# Patient Record
Sex: Female | Born: 1965 | Race: White | Hispanic: Refuse to answer | Marital: Married | Smoking: Former smoker
Health system: Southern US, Community
[De-identification: ages and names within clinical notes are randomized; demographics above are authoritative.]

---

## 2008-08-03 ENCOUNTER — Telehealth (INDEPENDENT_AMBULATORY_CARE_PROVIDER_SITE_OTHER): Payer: Self-pay | Admitting: *Deleted

## 2008-08-05 ENCOUNTER — Ambulatory Visit: Payer: Self-pay | Admitting: Internal Medicine

## 2008-08-05 DIAGNOSIS — M25559 Pain in unspecified hip: Secondary | ICD-10-CM

## 2008-08-05 DIAGNOSIS — R5382 Chronic fatigue, unspecified: Secondary | ICD-10-CM

## 2008-08-05 LAB — CONVERTED CEMR LAB
Basophils Absolute: 0.1 10*3/uL (ref 0.0–0.1)
Basophils Relative: 1.5 % (ref 0.0–3.0)
CO2: 27 meq/L (ref 19–32)
Chloride: 105 meq/L (ref 96–112)
Creatinine, Ser: 0.8 mg/dL (ref 0.4–1.2)
Direct LDL: 171.3 mg/dL
Eosinophils Absolute: 0 10*3/uL (ref 0.0–0.7)
Eosinophils Relative: 0.5 % (ref 0.0–5.0)
GFR calc non Af Amer: 83 mL/min
HDL: 61.9 mg/dL (ref >39.0)
LDL Cholesterol: 175 mg/dL — ABNORMAL HIGH (ref 0–99)
Lymphocytes Relative: 29.5 % (ref 12.0–46.0)
MCHC: 34.7 g/dL (ref 30.0–36.0)
Neutrophils Relative %: 62.4 % (ref 43.0–77.0)
Platelets: 218 10*3/uL (ref 150–400)
Potassium: 3.8 meq/L (ref 3.5–5.1)
RBC: 4.07 M/uL (ref 3.87–5.11)
Rhuematoid fact SerPl-aCnc: <20 intl units/mL — ABNORMAL LOW (ref 0.0–20.0)
Sed Rate: 15 mm/hr (ref 0–22)
VLDL: 11 mg/dL (ref 0–40)
Vit D, 25-Hydroxy: 23 ng/mL — ABNORMAL LOW (ref 30–89)
Vitamin B-12: 395 pg/mL (ref 211–911)
WBC: 5.2 10*3/uL (ref 4.5–10.5)

## 2009-10-05 ENCOUNTER — Ambulatory Visit: Payer: Self-pay | Admitting: Internal Medicine

## 2009-10-05 DIAGNOSIS — R143 Flatulence: Secondary | ICD-10-CM

## 2009-10-05 DIAGNOSIS — K219 Gastro-esophageal reflux disease without esophagitis: Secondary | ICD-10-CM | POA: Insufficient documentation

## 2009-10-05 DIAGNOSIS — R141 Gas pain: Secondary | ICD-10-CM

## 2009-10-05 DIAGNOSIS — R142 Eructation: Secondary | ICD-10-CM

## 2009-10-12 LAB — CONVERTED CEMR LAB
Alkaline Phosphatase: 40 units/L (ref 39–117)
BUN: 15 mg/dL (ref 6–23)
Basophils Absolute: 0 10*3/uL (ref 0.0–0.1)
Basophils Relative: 0.6 % (ref 0.0–3.0)
Bilirubin, Direct: 0.1 mg/dL (ref 0.0–0.3)
CO2: 29 meq/L (ref 19–32)
Calcium: 9.2 mg/dL (ref 8.4–10.5)
Chloride: 107 meq/L (ref 96–112)
Creatinine, Ser: 0.8 mg/dL (ref 0.4–1.2)
Direct LDL: 182.5 mg/dL
Eosinophils Absolute: 0.1 10*3/uL (ref 0.0–0.7)
Glucose, Bld: 96 mg/dL (ref 70–99)
HDL: 57.6 mg/dL (ref >39.00)
Ketones, ur: NEGATIVE mg/dL
Lymphocytes Relative: 25.5 % (ref 12.0–46.0)
MCHC: 34.2 g/dL (ref 30.0–36.0)
MCV: 90.9 fL (ref 78.0–100.0)
Monocytes Absolute: 0.3 10*3/uL (ref 0.1–1.0)
Neutrophils Relative %: 65.5 % (ref 43.0–77.0)
Platelets: 261 10*3/uL (ref 150.0–400.0)
RDW: 15.8 % — ABNORMAL HIGH (ref 11.5–14.6)
Sed Rate: 20 mm/hr (ref 0–22)
Specific Gravity, Urine: <=1.005 (ref 1.000–1.030)
TSH: 2.76 microintl units/mL (ref 0.35–5.50)
Total Protein, Urine: NEGATIVE mg/dL
Total Protein: 7 g/dL (ref 6.0–8.3)
Triglycerides: 54 mg/dL (ref 0.0–149.0)
Urine Glucose: NEGATIVE mg/dL
VLDL: 10.8 mg/dL (ref 0.0–40.0)

## 2010-07-18 NOTE — Assessment & Plan Note (Signed)
Summary: CPX /BCBS / # / CD   Vital Signs:  Patient profile:   45 year old female Height:      62 inches Weight:      137.50 pounds BMI:     25.24 O2 Sat:      97 % on Room air Temp:     97.5 degrees F oral Pulse rate:   64 / minute BP sitting:   100 / 68  (left arm) Cuff size:   regular  Vitals Entered By: Lucious Groves (October 05, 2009 8:48 AM)  O2 Flow:  Room air CC: CPX./kb Is Patient Diabetic? No Pain Assessment Patient in pain? no        CC:  CPX./kb.  History of Present Illness: The patient presents for a wellness examination  C/o occasional GERD; had an episode of abd and CP 6 months ago after eating old quinoa  Current Medications (verified): 1)  Ibu 800 Mg Tabs (Ibuprofen) .Marland Kitchen.. 1 Tab By Mouth Three Times A Day As Needed Pain 2)  Vitamin D3 1000 Unit  Tabs (Cholecalciferol) .Marland Kitchen.. 1 By Mouth Daily 3)  Vitamin D 16109 Unit  Caps (Ergocalciferol) .Marland Kitchen.. 1 By Mouth Weekly  Allergies (verified): No Known Drug Allergies  Past History:  Past Surgical History: Last updated: 08/05/2008 Denies surgical history  Family History: Last updated: 08/05/2008 M 52 MI F DM Family History of CAD Female 1st degree relative <60  Social History: Last updated: 08/05/2008 Occupation: business owner Married Former Smoker quit 2005  Past Medical History: Migraines Fam h/o CAD Vit D def 2010 GERD 2010  Review of Systems  The patient denies anorexia, fever, weight loss, weight gain, vision loss, decreased hearing, hoarseness, chest pain, syncope, dyspnea on exertion, peripheral edema, prolonged cough, headaches, hemoptysis, abdominal pain, melena, hematochezia, severe indigestion/heartburn, hematuria, incontinence, genital sores, muscle weakness, suspicious skin lesions, transient blindness, difficulty walking, depression, unusual weight change, abnormal bleeding, enlarged lymph nodes, angioedema, and breast masses.    Physical Exam  General:   Well-developed,well-nourished,in no acute distress; alert,appropriate and cooperative throughout examination Head:  Normocephalic and atraumatic without obvious abnormalities. No apparent alopecia or balding. Eyes:  No corneal or conjunctival inflammation noted. EOMI. Perrla. Ears:  External ear exam shows no significant lesions or deformities.  Otoscopic examination reveals clear canals, tympanic membranes are intact bilaterally without bulging, retraction, inflammation or discharge. Hearing is grossly normal bilaterally. Nose:  External nasal examination shows no deformity or inflammation. Nasal mucosa are pink and moist without lesions or exudates. Mouth:  Oral mucosa and oropharynx without lesions or exudates.  Teeth in good repair. Neck:  No deformities, masses, or tenderness noted. Chest Wall:  No deformities, masses, or tenderness noted. Lungs:  Normal respiratory effort, chest expands symmetrically. Lungs are clear to auscultation, no crackles or wheezes. Heart:  Normal rate and regular rhythm. S1 and S2 normal without gallop, murmur, click, rub or other extra sounds. Abdomen:  Bowel sounds positive,abdomen soft and non-tender without masses, organomegaly or hernias noted. Msk:  WNL Extremities:  No clubbing, cyanosis, edema, or deformity noted with normal full range of motion of all joints.   Neurologic:  No cranial nerve deficits noted. Station and gait are normal. Plantar reflexes are down-going bilaterally. DTRs are symmetrical throughout. Sensory, motor and coordinative functions appear intact. Skin:  Intact without suspicious lesions or rashes Cervical Nodes:  No lymphadenopathy noted Inguinal Nodes:  No significant adenopathy Psych:  Cognition and judgment appear intact. Alert and cooperative with normal attention span and concentration.  No apparent delusions, illusions, hallucinations   Impression & Recommendations:  Problem # 1:  WELL ADULT EXAM (ICD-V70.0) Assessment  New Health and age related issues were discussed. Available screening tests and vaccinations were discussed as well. Healthy life style including good diet and execise was discussed.  The labs were reviewed with the patient.  Orders: TLB-BMP (Basic Metabolic Panel-BMET) (80048-METABOL) TLB-CBC Platelet - w/Differential (85025-CBCD) TLB-Hepatic/Liver Function Pnl (80076-HEPATIC) TLB-Lipid Panel (80061-LIPID) TLB-Sedimentation Rate (ESR) (85652-ESR) TLB-TSH (Thyroid Stimulating Hormone) (84443-TSH) TLB-Udip ONLY (81003-UDIP)  Problem # 2:  GERD (ICD-530.81) Assessment: New  Declined EGD, [place] Her updated medication list for this problem includes:    Prilosec 20 Mg Cpdr (Omeprazole) .Marland Kitchen... 1 by mouth once daily as needed reflux  Orders: TLB-Sedimentation Rate (ESR) (85652-ESR) TLB-H. Pylori Abs(Helicobacter Pylori) (86677-HELICO)  Problem # 3:  FATIGUE (ICD-780.79) Assessment: Improved  Complete Medication List: 1)  Ibu 800 Mg Tabs (Ibuprofen) .Marland Kitchen.. 1 tab by mouth three times a day as needed pain 2)  Vitamin D3 1000 Unit Tabs (Cholecalciferol) .Marland Kitchen.. 1 by mouth daily 3)  Prilosec 20 Mg Cpdr (Omeprazole) .Marland Kitchen.. 1 by mouth once daily as needed reflux 4)  Metronidazole 250 Mg Tabs (Metronidazole) .Marland Kitchen.. 1 by mouth qid  Other Orders: EKG w/ Interpretation (93000)  Patient Instructions: 1)  Please schedule a follow-up appointment in 1 year. Prescriptions: METRONIDAZOLE 250 MG TABS (METRONIDAZOLE) 1 by mouth qid  #40 x 1   Entered and Authorized by:   Tresa Garter MD   Signed by:   Tresa Garter MD on 10/05/2009   Method used:   Print then Give to Patient   RxID:   [id number]

## 2014-08-18 ENCOUNTER — Telehealth: Payer: Self-pay | Admitting: Internal Medicine

## 2014-08-18 NOTE — Telephone Encounter (Signed)
Pt called request to reestablish, pt stated last ov was 4 years ago and she didn't have insurance that's why she didn't come for check up. Please advise.

## 2014-08-18 NOTE — Telephone Encounter (Signed)
Ok Thx 

## 2014-08-18 NOTE — Telephone Encounter (Signed)
Left vm for pt to call back and schedule an appt with Plot

## 2014-08-24 ENCOUNTER — Ambulatory Visit (INDEPENDENT_AMBULATORY_CARE_PROVIDER_SITE_OTHER): Payer: No Typology Code available for payment source | Admitting: Podiatry

## 2014-08-24 ENCOUNTER — Encounter: Payer: Self-pay | Admitting: Podiatry

## 2014-08-24 VITALS — BP 130/76 | HR 61 | Ht 62.0 in | Wt 140.0 lb

## 2014-08-24 DIAGNOSIS — M216X2 Other acquired deformities of left foot: Secondary | ICD-10-CM

## 2014-08-24 DIAGNOSIS — M216X1 Other acquired deformities of right foot: Secondary | ICD-10-CM | POA: Diagnosis not present

## 2014-08-24 DIAGNOSIS — M722 Plantar fascial fibromatosis: Secondary | ICD-10-CM

## 2014-08-24 DIAGNOSIS — M21969 Unspecified acquired deformity of unspecified lower leg: Secondary | ICD-10-CM | POA: Diagnosis not present

## 2014-08-24 NOTE — Patient Instructions (Signed)
Seen for right heel pain. Noted of weakened first metatarsal bone. Metatarsal binder dispensed x 2. Need custom orthotics.

## 2014-08-24 NOTE — Progress Notes (Signed)
Subjective: 49 year old female presents complaining of pain in right heel. The foot has been bothering her since August 2015. 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 She is unable to run like she used to and unable to perform yoga like usual. Hurts on right heel more than left. Stays on feet much from activity, but sits at work. Wears dress shoe with heels at work.  Review of Systems - Negative except chief complaint.  Objective: Neurovascular status are within normal. No edema or erythema noted.  Dermatologic: Verruca like lesion, plantar wart sub 3 right, 4 left each about 1cm in diameter. She also has similar lesion on her left thumb. Orthopedic: High arched cavus type foot. Hypermobile first bilateral. Excess STJ pronation with weight bearing bilateral. Positive of irregular irregular dorsal spurring at Lisfranc joint left foot.  Right heel pain.   Assessment: Plantar fasciitis right. Hypermobile first ray bilateral. STJ hyperpronation bilateral.  Plan: Reviewed findings and available options. Metatarsal binder dispensed x 2. Return for custom orthotics. Stay in lace up tennis shoes and avoid flat shoes. May take immune boosting vitamins for multiple wart problem.

## 2014-08-30 ENCOUNTER — Ambulatory Visit (INDEPENDENT_AMBULATORY_CARE_PROVIDER_SITE_OTHER): Payer: No Typology Code available for payment source | Admitting: Internal Medicine

## 2014-08-30 ENCOUNTER — Encounter: Payer: Self-pay | Admitting: Internal Medicine

## 2014-08-30 ENCOUNTER — Ambulatory Visit (INDEPENDENT_AMBULATORY_CARE_PROVIDER_SITE_OTHER)
Admission: RE | Admit: 2014-08-30 | Discharge: 2014-08-30 | Disposition: A | Discharge status: Home / Self Care | Payer: No Typology Code available for payment source | Source: Ambulatory Visit | Attending: Internal Medicine | Admitting: Internal Medicine

## 2014-08-30 VITALS — BP 110/84 | HR 68 | Ht 62.0 in | Wt 141.0 lb

## 2014-08-30 DIAGNOSIS — R142 Eructation: Secondary | ICD-10-CM

## 2014-08-30 DIAGNOSIS — R141 Gas pain: Secondary | ICD-10-CM

## 2014-08-30 DIAGNOSIS — Z8249 Family history of ischemic heart disease and other diseases of the circulatory system: Secondary | ICD-10-CM | POA: Insufficient documentation

## 2014-08-30 DIAGNOSIS — M2550 Pain in unspecified joint: Secondary | ICD-10-CM | POA: Insufficient documentation

## 2014-08-30 DIAGNOSIS — R143 Flatulence: Secondary | ICD-10-CM

## 2014-08-30 DIAGNOSIS — E049 Nontoxic goiter, unspecified: Secondary | ICD-10-CM

## 2014-08-30 DIAGNOSIS — M722 Plantar fascial fibromatosis: Secondary | ICD-10-CM

## 2014-08-30 DIAGNOSIS — Z0000 Encounter for general adult medical examination without abnormal findings: Secondary | ICD-10-CM | POA: Insufficient documentation

## 2014-08-30 DIAGNOSIS — R5382 Chronic fatigue, unspecified: Secondary | ICD-10-CM

## 2014-08-30 MED ORDER — ASPIRIN 81 MG PO CHEW: 81.0000 mg | CHEWABLE_TABLET | Freq: Every day | ORAL | Status: DC

## 2014-08-30 MED ORDER — VITAMIN D 1000 UNITS PO TABS: 1000.0000 [IU] | ORAL_TABLET | Freq: Every day | ORAL | Status: AC

## 2014-08-30 NOTE — Assessment & Plan Note (Addendum)
We discussed age appropriate health related issues, including available/recomended screening tests and vaccinations. We discussed a need for adhering to healthy diet and exercise. Labs/EKG were reviewed/ordered. All questions were answered. Pt had a GYN exam. Colonoscopy was declined. Given info on cologuard Vit D qd

## 2014-08-30 NOTE — Progress Notes (Signed)
   Subjective:    HPI  The patient is here for a wellness exam. She is a vegetarian  C/o arthralgias x years - worse Pain is 5-7/10 C/o fatigue  C/o fluid retention C/o insomnia  Wt Readings from Last 3 Encounters:  08/30/14 141 lb (63.957 kg)  08/24/14 140 lb (63.504 kg)  10/05/09 137 lb 8 oz (62.37 kg)   BP Readings from Last 3 Encounters:  08/30/14 110/84  08/24/14 130/76  10/05/09 100/68       Review of Systems  Constitutional: Negative for chills, activity change, appetite change, fatigue and unexpected weight change.  HENT: Negative for congestion, mouth sores and sinus pressure.   Eyes: Negative for visual disturbance.  Respiratory: Negative for cough and chest tightness.   Gastrointestinal: Negative for nausea and abdominal pain.  Genitourinary: Negative for frequency, difficulty urinating and vaginal pain.  Musculoskeletal: Positive for myalgias, back pain, arthralgias and neck pain. Negative for joint swelling, gait problem and neck stiffness.  Skin: Negative for pallor and rash.  Neurological: Negative for dizziness, tremors, weakness, numbness and headaches.  Psychiatric/Behavioral: Negative for suicidal ideas, confusion and sleep disturbance. The patient is not nervous/anxious.        Objective:   Physical Exam  Constitutional: She appears well-developed. No distress.  HENT:  Head: Normocephalic.  Right Ear: External ear normal.  Left Ear: External ear normal.  Nose: Nose normal.  Mouth/Throat: Oropharynx is clear and moist.  Eyes: Conjunctivae are normal. Pupils are equal, round, and reactive to light. Right eye exhibits no discharge. Left eye exhibits no discharge.  Neck: Normal range of motion. Neck supple. No JVD present. No tracheal deviation present. No thyromegaly present.  Cardiovascular: Normal rate, regular rhythm and normal heart sounds.   Pulmonary/Chest: No stridor. No respiratory distress. She has no wheezes.  Abdominal: Soft. Bowel  sounds are normal. She exhibits no distension and no mass. There is no tenderness. There is no rebound and no guarding.  Musculoskeletal: She exhibits no edema or tenderness.  Lymphadenopathy:    She has no cervical adenopathy.  Neurological: She displays normal reflexes. No cranial nerve deficit. She exhibits normal muscle tone. Coordination normal.  Skin: No rash noted. No erythema.  Psychiatric: She has a normal mood and affect. Her behavior is normal. Judgment and thought content normal.  mild goiter Very tender all over  Lab Results  Component Value Date   WBC 5.0 10/05/2009   HGB 12.2 10/05/2009   HCT 35.8* 10/05/2009   PLT 261.0 10/05/2009   GLUCOSE 96 10/05/2009   CHOL 254* 10/05/2009   TRIG 54.0 10/05/2009   HDL 57.60 10/05/2009   LDLDIRECT 182.5 10/05/2009   LDLCALC 175* 08/05/2008   ALT 19 10/05/2009   AST 21 10/05/2009   NA 142 10/05/2009   K 4.4 10/05/2009   CL 107 10/05/2009   CREATININE 0.8 10/05/2009   BUN 15 10/05/2009   CO2 29 10/05/2009   TSH 2.76 10/05/2009         Assessment & Plan:

## 2014-08-30 NOTE — Assessment & Plan Note (Signed)
Painful Thyroid US ordered

## 2014-08-30 NOTE — Assessment & Plan Note (Addendum)
3/16 mother died of an MI EKG Labs Start baby ASA Pt had an ECHO a couple years ago

## 2014-08-30 NOTE — Assessment & Plan Note (Signed)
Labs Pelvis X-ray

## 2014-08-30 NOTE — Assessment & Plan Note (Signed)
Labs

## 2014-08-30 NOTE — Assessment & Plan Note (Signed)
2016 multiple joints, chronic x years  ?etiology Labs Pelvis X ray Rheumatology ref offered Cymbalta option discussed Vit D, ASA po

## 2014-08-30 NOTE — Assessment & Plan Note (Signed)
2016 Dr Raynald KempSheard R>L

## 2014-08-31 ENCOUNTER — Other Ambulatory Visit (INDEPENDENT_AMBULATORY_CARE_PROVIDER_SITE_OTHER): Payer: No Typology Code available for payment source

## 2014-08-31 DIAGNOSIS — M2550 Pain in unspecified joint: Secondary | ICD-10-CM

## 2014-08-31 DIAGNOSIS — Z0000 Encounter for general adult medical examination without abnormal findings: Secondary | ICD-10-CM

## 2014-08-31 DIAGNOSIS — R5382 Chronic fatigue, unspecified: Secondary | ICD-10-CM

## 2014-08-31 DIAGNOSIS — Z8249 Family history of ischemic heart disease and other diseases of the circulatory system: Secondary | ICD-10-CM

## 2014-08-31 DIAGNOSIS — E049 Nontoxic goiter, unspecified: Secondary | ICD-10-CM

## 2014-08-31 LAB — CBC WITH DIFFERENTIAL/PLATELET
BASOS ABS: 0 10*3/uL (ref 0.0–0.1)
Basophils Relative: 1.1 % (ref 0.0–3.0)
Eosinophils Absolute: 0.1 10*3/uL (ref 0.0–0.7)
Eosinophils Relative: 2.6 % (ref 0.0–5.0)
HCT: 36.8 % (ref 36.0–46.0)
Hemoglobin: 12.6 g/dL (ref 12.0–15.0)
Lymphocytes Relative: 42.4 % (ref 12.0–46.0)
Lymphs Abs: 1.8 10*3/uL (ref 0.7–4.0)
MCHC: 34.3 g/dL (ref 30.0–36.0)
MCV: 94 fl (ref 78.0–100.0)
MONOS PCT: 8.5 % (ref 3.0–12.0)
Monocytes Absolute: 0.4 10*3/uL (ref 0.1–1.0)
NEUTROS ABS: 1.9 10*3/uL (ref 1.4–7.7)
Neutrophils Relative %: 45.4 % (ref 43.0–77.0)
PLATELETS: 251 10*3/uL (ref 150.0–400.0)
RBC: 3.91 Mil/uL (ref 3.87–5.11)
RDW: 13.3 % (ref 11.5–15.5)
WBC: 4.2 10*3/uL (ref 4.0–10.5)

## 2014-08-31 LAB — URINALYSIS, ROUTINE W REFLEX MICROSCOPIC
BILIRUBIN URINE: NEGATIVE
Ketones, ur: NEGATIVE
Leukocytes, UA: NEGATIVE
Nitrite: NEGATIVE
Specific Gravity, Urine: <=1.005 — AB (ref 1.000–1.030)
Total Protein, Urine: NEGATIVE
URINE GLUCOSE: NEGATIVE
UROBILINOGEN UA: 0.2 (ref 0.0–1.0)
pH: 6 (ref 5.0–8.0)

## 2014-08-31 LAB — BASIC METABOLIC PANEL
BUN: 12 mg/dL (ref 6–23)
CHLORIDE: 104 meq/L (ref 96–112)
CO2: 27 mEq/L (ref 19–32)
Calcium: 9.1 mg/dL (ref 8.4–10.5)
Creatinine, Ser: 0.87 mg/dL (ref 0.40–1.20)
GFR: 73.18 mL/min (ref >60.00)
Glucose, Bld: 101 mg/dL — ABNORMAL HIGH (ref 70–99)
POTASSIUM: 4.2 meq/L (ref 3.5–5.1)
Sodium: 137 mEq/L (ref 135–145)

## 2014-08-31 LAB — VITAMIN B12: Vitamin B-12: 564 pg/mL (ref 211–911)

## 2014-08-31 LAB — LIPID PANEL
CHOLESTEROL: 271 mg/dL — AB (ref 0–200)
HDL: 67.3 mg/dL (ref >39.00)
LDL Cholesterol: 190 mg/dL — ABNORMAL HIGH (ref 0–99)
NonHDL: 203.7
Total CHOL/HDL Ratio: 4
Triglycerides: 67 mg/dL (ref 0.0–149.0)
VLDL: 13.4 mg/dL (ref 0.0–40.0)

## 2014-08-31 LAB — HEPATIC FUNCTION PANEL
ALT: 23 U/L (ref 0–35)
AST: 22 U/L (ref 0–37)
Albumin: 4.3 g/dL (ref 3.5–5.2)
Alkaline Phosphatase: 47 U/L (ref 39–117)
Bilirubin, Direct: 0.1 mg/dL (ref 0.0–0.3)
TOTAL PROTEIN: 7 g/dL (ref 6.0–8.3)
Total Bilirubin: 0.5 mg/dL (ref 0.2–1.2)

## 2014-08-31 LAB — VITAMIN D 25 HYDROXY (VIT D DEFICIENCY, FRACTURES): VITD: 27.25 ng/mL — ABNORMAL LOW (ref 30.00–100.00)

## 2014-08-31 LAB — CK: Total CK: 83 U/L (ref 7–177)

## 2014-08-31 LAB — SEDIMENTATION RATE: SED RATE: 21 mm/h (ref 0–22)

## 2014-08-31 LAB — URIC ACID: URIC ACID, SERUM: 3.4 mg/dL (ref 2.4–7.0)

## 2014-08-31 LAB — MAGNESIUM: Magnesium: 2.2 mg/dL (ref 1.5–2.5)

## 2014-08-31 LAB — TSH: TSH: 3.35 u[IU]/mL (ref 0.35–4.50)

## 2014-09-01 ENCOUNTER — Ambulatory Visit
Admission: RE | Admit: 2014-09-01 | Discharge: 2014-09-01 | Disposition: A | Discharge status: Home / Self Care | Payer: No Typology Code available for payment source | Source: Ambulatory Visit | Attending: Internal Medicine | Admitting: Internal Medicine

## 2014-09-01 DIAGNOSIS — R5382 Chronic fatigue, unspecified: Secondary | ICD-10-CM

## 2014-09-01 DIAGNOSIS — E049 Nontoxic goiter, unspecified: Secondary | ICD-10-CM

## 2014-09-01 LAB — RHEUMATOID FACTOR

## 2014-09-01 LAB — ANA: Anti Nuclear Antibody(ANA): NEGATIVE

## 2014-09-02 MED ORDER — ERGOCALCIFEROL 1.25 MG (50000 UT) PO CAPS: 50000.0000 [IU] | ORAL_CAPSULE | ORAL | Status: DC

## 2014-09-02 NOTE — Progress Notes (Unsigned)
   Subjective:    Patient ID: Alexandria EndoAlicja Cook, female    DOB: 19-Nov-1965, 49 y.o.   MRN: [id number]  HPI    Review of Systems     Objective:   Physical Exam        Assessment & Plan:

## 2014-09-23 ENCOUNTER — Encounter: Payer: Self-pay | Admitting: Internal Medicine

## 2014-09-23 ENCOUNTER — Ambulatory Visit (INDEPENDENT_AMBULATORY_CARE_PROVIDER_SITE_OTHER): Payer: No Typology Code available for payment source | Admitting: Internal Medicine

## 2014-09-23 VITALS — BP 124/76 | HR 61 | Wt 140.0 lb

## 2014-09-23 DIAGNOSIS — M25552 Pain in left hip: Principal | ICD-10-CM

## 2014-09-23 DIAGNOSIS — M25551 Pain in right hip: Secondary | ICD-10-CM

## 2014-09-23 MED ORDER — ERGOCALCIFEROL 1.25 MG (50000 UT) PO CAPS: 50000.0000 [IU] | ORAL_CAPSULE | ORAL | Status: DC

## 2014-09-23 NOTE — Patient Instructions (Signed)
Postprocedure instructions :    A Band-Aid should be left on for 12 hours. Injection therapy is not a cure itself. It is used in conjunction with other modalities. You can use nonsteroidal anti-inflammatories like ibuprofen , hot and cold compresses. Rest is recommended in the next 24 hours. You need to report immediately  if fever, chills or any signs of infection develop. 

## 2014-09-23 NOTE — Progress Notes (Signed)
R heel pain B hip pain   Procedure Note :     Procedure : Joint Injection,  R and L  hip   Indication:  Trochanteric B bursitis with refractory  chronic pain.   Risks including unsuccessful procedure , bleeding, infection, bruising, skin atrophy, "steroid flare-up" and others were explained to the patient in detail as well as the benefits. Informed consent was obtained and signed.   Tthe patient was placed in a comfortable lateral decubitus position. The point of maximal tenderness was identified. Skin was prepped with Betadine and alcohol. Then, a 5 cc syringe with a 2 inch long 24-gauge needle was used for a bursa injection.. The needle was advanced  Into the bursa. I injected the bursa with 4 mL of 2% lidocaine and 40 mg of Depo-Medrol each.  Band-Aid x2 was applied.   Tolerated well. Complications: None. Good pain relief following the procedure.      Procedure Note :     Procedure : R  heel injection   Indication:   plantar fasciitis with refractory  chronic pain.   Risks including unsuccessful procedure , bleeding, infection, bruising, skin atrophy, "steroid flare-up" and others were explained to the patient in detail as well as the benefits. Informed consent was obtained and signed.   Tthe patient was placed in a comfortable  decubitus position. The point of maximal tenderness was identified. Skin was prepped with Betadine and alcohol. Then, a 5 cc syringe with a 1.5 inch long 25-gauge needle was used for a heel injection.. The needle was advanced and I injected the heel with 2 mL of 2% lidocaine and 20 mg of Depo-Medrol .  Band-Aid was applied.   Tolerated well. Complications: None. Good pain relief following the procedure. .Marland Kitchen

## 2017-06-13 ENCOUNTER — Encounter: Payer: Self-pay | Admitting: Internal Medicine

## 2017-06-13 MED ORDER — METHYLPREDNISOLONE ACETATE 40 MG/ML IJ SUSP: 20.0000 mg | Freq: Once | INTRAMUSCULAR | Status: DC

## 2017-06-13 MED ORDER — METHYLPREDNISOLONE ACETATE 40 MG/ML IJ SUSP: 40.0000 mg | Freq: Once | INTRAMUSCULAR | Status: DC

## 2019-05-20 ENCOUNTER — Telehealth: Payer: Self-pay | Admitting: General Practice

## 2019-05-20 NOTE — Telephone Encounter (Signed)
Copied from [place] [id number]. Topic: Appointment Scheduling - Scheduling Inquiry for Clinic >> May 20, 2019 10:44 AM Erick Blinks wrote: Pt called wanting to re-establish with Dr. Alain Marion, please advise if PCP is willing to take her back on. Best contact: [phone]  Dr.Plot do you approve?

## 2019-05-20 NOTE — Telephone Encounter (Signed)
Ok Thx 

## 2019-05-20 NOTE — Telephone Encounter (Signed)
Appointment has been made for 12/8.

## 2019-05-26 ENCOUNTER — Encounter: Payer: Self-pay | Admitting: Internal Medicine

## 2019-05-26 ENCOUNTER — Other Ambulatory Visit: Payer: Self-pay

## 2019-05-26 ENCOUNTER — Other Ambulatory Visit (INDEPENDENT_AMBULATORY_CARE_PROVIDER_SITE_OTHER): Payer: Self-pay

## 2019-05-26 ENCOUNTER — Ambulatory Visit (INDEPENDENT_AMBULATORY_CARE_PROVIDER_SITE_OTHER): Payer: Self-pay | Admitting: Internal Medicine

## 2019-05-26 DIAGNOSIS — E559 Vitamin D deficiency, unspecified: Secondary | ICD-10-CM

## 2019-05-26 DIAGNOSIS — E039 Hypothyroidism, unspecified: Secondary | ICD-10-CM | POA: Insufficient documentation

## 2019-05-26 DIAGNOSIS — M2550 Pain in unspecified joint: Secondary | ICD-10-CM

## 2019-05-26 DIAGNOSIS — Z8249 Family history of ischemic heart disease and other diseases of the circulatory system: Secondary | ICD-10-CM

## 2019-05-26 DIAGNOSIS — E063 Autoimmune thyroiditis: Secondary | ICD-10-CM

## 2019-05-26 DIAGNOSIS — R7989 Other specified abnormal findings of blood chemistry: Secondary | ICD-10-CM

## 2019-05-26 DIAGNOSIS — E790 Hyperuricemia without signs of inflammatory arthritis and tophaceous disease: Secondary | ICD-10-CM

## 2019-05-26 DIAGNOSIS — E785 Hyperlipidemia, unspecified: Secondary | ICD-10-CM

## 2019-05-26 DIAGNOSIS — E038 Other specified hypothyroidism: Secondary | ICD-10-CM

## 2019-05-26 LAB — CBC WITH DIFFERENTIAL/PLATELET
Basophils Absolute: 0 10*3/uL (ref 0.0–0.1)
Basophils Relative: 1 % (ref 0.0–3.0)
Eosinophils Absolute: 0.1 10*3/uL (ref 0.0–0.7)
Eosinophils Relative: 2.1 % (ref 0.0–5.0)
HCT: 38 % (ref 36.0–46.0)
Hemoglobin: 12.7 g/dL (ref 12.0–15.0)
Lymphocytes Relative: 41.3 % (ref 12.0–46.0)
Lymphs Abs: 1.8 10*3/uL (ref 0.7–4.0)
MCHC: 33.3 g/dL (ref 30.0–36.0)
MCV: 95.4 fl (ref 78.0–100.0)
Monocytes Absolute: 0.3 10*3/uL (ref 0.1–1.0)
Monocytes Relative: 7.7 % (ref 3.0–12.0)
Neutro Abs: 2.1 10*3/uL (ref 1.4–7.7)
Neutrophils Relative %: 47.9 % (ref 43.0–77.0)
Platelets: 238 10*3/uL (ref 150.0–400.0)
RBC: 3.99 Mil/uL (ref 3.87–5.11)
RDW: 13.4 % (ref 11.5–15.5)
WBC: 4.4 10*3/uL (ref 4.0–10.5)

## 2019-05-26 LAB — HEPATIC FUNCTION PANEL
ALT: 57 U/L — ABNORMAL HIGH (ref 0–35)
AST: 34 U/L (ref 0–37)
Albumin: 4.3 g/dL (ref 3.5–5.2)
Alkaline Phosphatase: 75 U/L (ref 39–117)
Bilirubin, Direct: 0 mg/dL (ref 0.0–0.3)
Total Bilirubin: 0.4 mg/dL (ref 0.2–1.2)
Total Protein: 7.2 g/dL (ref 6.0–8.3)

## 2019-05-26 LAB — VITAMIN B12: Vitamin B-12: 416 pg/mL (ref 211–911)

## 2019-05-26 LAB — T3, FREE: T3, Free: 3.2 pg/mL (ref 2.3–4.2)

## 2019-05-26 LAB — URIC ACID: Uric Acid, Serum: 5 mg/dL (ref 2.4–7.0)

## 2019-05-26 LAB — BASIC METABOLIC PANEL
BUN: 24 mg/dL — ABNORMAL HIGH (ref 6–23)
CO2: 25 mEq/L (ref 19–32)
Calcium: 9.4 mg/dL (ref 8.4–10.5)
Chloride: 105 mEq/L (ref 96–112)
Creatinine, Ser: 0.93 mg/dL (ref 0.40–1.20)
GFR: 62.59 mL/min (ref >60.00)
Glucose, Bld: 97 mg/dL (ref 70–99)
Potassium: 3.9 mEq/L (ref 3.5–5.1)
Sodium: 139 mEq/L (ref 135–145)

## 2019-05-26 LAB — VITAMIN D 25 HYDROXY (VIT D DEFICIENCY, FRACTURES): VITD: 43.6 ng/mL (ref 30.00–100.00)

## 2019-05-26 LAB — SEDIMENTATION RATE: Sed Rate: 18 mm/hr (ref 0–30)

## 2019-05-26 LAB — T4, FREE: Free T4: 0.91 ng/dL (ref 0.60–1.60)

## 2019-05-26 LAB — TSH: TSH: 1.71 u[IU]/mL (ref 0.35–4.50)

## 2019-05-26 MED ORDER — VITAMIN D3 50 MCG (2000 UT) PO CAPS: 2000.0000 [IU] | ORAL_CAPSULE | Freq: Every day | ORAL | 3 refills | Status: AC

## 2019-05-26 NOTE — Assessment & Plan Note (Signed)
Labs Vit D 

## 2019-05-26 NOTE — Assessment & Plan Note (Signed)
Repeat uric acid

## 2019-05-26 NOTE — Assessment & Plan Note (Signed)
On Levothyroxine 50 mcg/d x 2 weeks Labs

## 2019-05-26 NOTE — Assessment & Plan Note (Signed)
A cardiac CT scan for calcium scoring offered 

## 2019-05-26 NOTE — Assessment & Plan Note (Signed)
Lyme disease was diagnosed in [place] in 2020

## 2019-05-26 NOTE — Assessment & Plan Note (Signed)
Chronic.  Declined statins. Coronary calcium CT ordered 12/20

## 2019-05-26 NOTE — Patient Instructions (Signed)

## 2019-05-26 NOTE — Assessment & Plan Note (Signed)
Hep tests were (-) and [place] was (-) in [place]

## 2019-05-26 NOTE — Progress Notes (Signed)
Subjective:  Patient ID: Alexandria Cook, female    DOB: 1965/10/19  Age: 53 y.o. MRN: [id number]  CC: No chief complaint on file.   HPI Alexandria Cook presents for a new pt visit - not seen in >4 years F/u Hashimoto - low thyroid function -was diagnosed in [place] and attributed to remote Lyme disease C/o Lyme disease for years with a lot of arthralgias and fatigue -she was diagnosed in [place] and treated with numerous supplements.  Apparently there the patient's husband Molly Maduro had Lyme disease as well She and her husband have presumed Covid 19 4 to 6 weeks ago. Blood work from: Shows elevated uric acid, elevated lipids  Outpatient Medications Prior to Visit  Medication Sig Dispense Refill  . levothyroxine (EUTHYROX) 25 MCG tablet Take 25 mcg by mouth daily before breakfast.    . aspirin (ASPIRIN CHILDRENS) 81 MG chewable tablet Chew 1 tablet (81 mg total) by mouth daily. 100 tablet 3  . ergocalciferol (VITAMIN D2) 50000 UNITS capsule Take 1 capsule (50,000 Units total) by mouth once a week. 6 capsule 0   Facility-Administered Medications Prior to Visit  Medication Dose Route Frequency Provider Last Rate Last Dose  . methylPREDNISolone acetate (DEPO-MEDROL) injection 20 mg  20 mg Intra-articular Once ,  V, MD      . methylPREDNISolone acetate (DEPO-MEDROL) injection 40 mg  40 mg Intramuscular Once ,  V, MD      . methylPREDNISolone acetate (DEPO-MEDROL) injection 40 mg  40 mg Intramuscular Once , Georgina Quint, MD        ROS: Review of Systems  Constitutional: Positive for fatigue. Negative for activity change, appetite change, chills, fever and unexpected weight change.  HENT: Negative for congestion, mouth sores and sinus pressure.   Eyes: Negative for visual disturbance.  Respiratory: Negative for cough and chest tightness.   Gastrointestinal: Negative for abdominal pain and nausea.  Genitourinary: Negative for difficulty urinating,  frequency and vaginal pain.  Musculoskeletal: Positive for arthralgias, back pain, gait problem and myalgias.  Skin: Negative for pallor and rash.  Neurological: Negative for dizziness, tremors, weakness, numbness and headaches.  Psychiatric/Behavioral: Negative for confusion, dysphoric mood, sleep disturbance and suicidal ideas.    Objective:  BP 124/82 (BP Location: Left Arm, Patient Position: Sitting, Cuff Size: Normal)   Pulse 61   Temp 98 F (36.7 C) (Oral)   Ht 5\' 2"  (1.575 m)   Wt 153 lb (69.4 kg)   SpO2 99%   BMI 27.98 kg/m   BP Readings from Last 3 Encounters:  05/26/19 124/82  09/23/14 124/76  08/30/14 110/84    Wt Readings from Last 3 Encounters:  05/26/19 153 lb (69.4 kg)  09/23/14 140 lb (63.5 kg)  08/30/14 141 lb (64 kg)    Physical Exam Constitutional:      General: She is not in acute distress.    Appearance: She is well-developed.  HENT:     Head: Normocephalic.     Right Ear: External ear normal.     Left Ear: External ear normal.     Nose: Nose normal.  Eyes:     General:        Right eye: No discharge.        Left eye: No discharge.     Conjunctiva/sclera: Conjunctivae normal.     Pupils: Pupils are equal, round, and reactive to light.  Neck:     Musculoskeletal: Normal range of motion and neck supple.     Thyroid: No thyromegaly.  Vascular: No JVD.     Trachea: No tracheal deviation.  Cardiovascular:     Rate and Rhythm: Normal rate and regular rhythm.     Heart sounds: Normal heart sounds.  Pulmonary:     Effort: No respiratory distress.     Breath sounds: No stridor. No wheezing.  Abdominal:     General: Bowel sounds are normal. There is no distension.     Palpations: Abdomen is soft. There is no mass.     Tenderness: There is no abdominal tenderness. There is no guarding or rebound.  Musculoskeletal:        General: No tenderness.     Right lower leg: No edema.     Left lower leg: No edema.  Lymphadenopathy:     Cervical: No  cervical adenopathy.  Skin:    Findings: No erythema or rash.  Neurological:     Mental Status: She is oriented to person, place, and time.     Cranial Nerves: No cranial nerve deficit.     Motor: No abnormal muscle tone.     Coordination: Coordination normal.     Deep Tendon Reflexes: Reflexes normal.  Psychiatric:        Behavior: Behavior normal.        Thought Content: Thought content normal.        Judgment: Judgment normal.   All joints including lumbar spine are with full range of motion, somewhat sensitive.  No skin rashes  Lab Results  Component Value Date   WBC 4.2 08/31/2014   HGB 12.6 08/31/2014   HCT 36.8 08/31/2014   PLT 251.0 08/31/2014   GLUCOSE 101 (H) 08/31/2014   CHOL 271 (H) 08/31/2014   TRIG 67.0 08/31/2014   HDL 67.30 08/31/2014   LDLDIRECT 182.5 10/05/2009   LDLCALC 190 (H) 08/31/2014   ALT 23 08/31/2014   AST 22 08/31/2014   NA 137 08/31/2014   K 4.2 08/31/2014   CL 104 08/31/2014   CREATININE 0.87 08/31/2014   BUN 12 08/31/2014   CO2 27 08/31/2014   TSH 3.35 08/31/2014    US Soft Tissue Head/neck  Result Date: 09/01/2014 CLINICAL DATA:  Thyroid goiter. EXAM: THYROID ULTRASOUND TECHNIQUE: Ultrasound examination of the thyroid gland and adjacent soft tissues was performed. COMPARISON:  None. FINDINGS: Right thyroid lobe Measurements: 3.8 x 1.5 x 1.7 cm. Thyroid tissue is mildly heterogeneous without a focal nodule. Left thyroid lobe Measurements: 3.3 x 1.5 x 1.1 cm.  No nodules visualized. Isthmus Thickness: 0.3 cm.  No nodules visualized. Lymphadenopathy None visualized. IMPRESSION: Unremarkable thyroid ultrasound. Thyroid tissue is mildly heterogeneous without nodules. Electronically Signed   By: Markus Daft M.D.   On: 09/01/2014 14:34    Assessment & Plan:   There are no diagnoses linked to this encounter.   No orders of the defined types were placed in this encounter.    Follow-up: No follow-ups on file.  Walker Kehr, MD

## 2019-05-27 LAB — SAR COV2 SEROLOGY (COVID19)AB(IGG),IA: SARS CoV2 AB IGG: POSITIVE — AB

## 2019-06-09 ENCOUNTER — Encounter: Payer: Self-pay | Admitting: Internal Medicine

## 2019-07-27 ENCOUNTER — Encounter: Payer: Self-pay | Admitting: Internal Medicine

## 2019-07-27 ENCOUNTER — Ambulatory Visit: Payer: Self-pay | Admitting: Internal Medicine

## 2019-07-27 ENCOUNTER — Other Ambulatory Visit: Payer: Self-pay

## 2019-07-27 DIAGNOSIS — B001 Herpesviral vesicular dermatitis: Secondary | ICD-10-CM | POA: Insufficient documentation

## 2019-07-27 DIAGNOSIS — M2550 Pain in unspecified joint: Secondary | ICD-10-CM

## 2019-07-27 DIAGNOSIS — E559 Vitamin D deficiency, unspecified: Secondary | ICD-10-CM

## 2019-07-27 MED ORDER — VALACYCLOVIR HCL 500 MG PO TABS: ORAL_TABLET | ORAL | 1 refills | Status: AC

## 2019-07-27 NOTE — Progress Notes (Signed)
Subjective:  Patient ID: Alexandria Cook, female    DOB: February 20, 1966  Age: 54 y.o. MRN: [id number]  CC: No chief complaint on file.   HPI Alexandria Cook presents for better on gluten free diet Vit D, B complex, Omega 3, Turmeric, CoQ10 C/o frequent cold sores Aunt died today  Outpatient Medications Prior to Visit  Medication Sig Dispense Refill  . Cholecalciferol (VITAMIN D3) 50 MCG (2000 UT) capsule Take 1 capsule (2,000 Units total) by mouth daily. 100 capsule 3  . levothyroxine (EUTHYROX) 25 MCG tablet Take 25 mcg by mouth daily before breakfast.     No facility-administered medications prior to visit.    ROS: Review of Systems  Constitutional: Negative for activity change, appetite change, chills, fatigue and unexpected weight change.  HENT: Negative for congestion, mouth sores and sinus pressure.   Eyes: Negative for visual disturbance.  Respiratory: Negative for cough and chest tightness.   Gastrointestinal: Negative for abdominal pain and nausea.  Genitourinary: Negative for difficulty urinating, frequency and vaginal pain.  Musculoskeletal: Positive for arthralgias. Negative for back pain and gait problem.  Skin: Negative for pallor and rash.  Neurological: Negative for dizziness, tremors, weakness, numbness and headaches.  Psychiatric/Behavioral: Negative for confusion and sleep disturbance.    Objective:  BP 122/82 (BP Location: Left Arm, Patient Position: Sitting, Cuff Size: Normal)   Pulse (!) 57   Temp 98.3 F (36.8 C) (Oral)   Ht 5\' 2"  (1.575 m)   Wt 151 lb (68.5 kg)   SpO2 96%   BMI 27.62 kg/m   BP Readings from Last 3 Encounters:  07/27/19 122/82  05/26/19 124/82  09/23/14 124/76    Wt Readings from Last 3 Encounters:  07/27/19 151 lb (68.5 kg)  05/26/19 153 lb (69.4 kg)  09/23/14 140 lb (63.5 kg)    Physical Exam Constitutional:      General: She is not in acute distress.    Appearance: She is well-developed.  HENT:     Head:  Normocephalic.     Right Ear: External ear normal.     Left Ear: External ear normal.     Nose: Nose normal.  Eyes:     General:        Right eye: No discharge.        Left eye: No discharge.     Conjunctiva/sclera: Conjunctivae normal.     Pupils: Pupils are equal, round, and reactive to light.  Neck:     Thyroid: No thyromegaly.     Vascular: No JVD.     Trachea: No tracheal deviation.  Cardiovascular:     Rate and Rhythm: Normal rate and regular rhythm.     Heart sounds: Normal heart sounds.  Pulmonary:     Effort: No respiratory distress.     Breath sounds: No stridor. No wheezing.  Abdominal:     General: Bowel sounds are normal. There is no distension.     Palpations: Abdomen is soft. There is no mass.     Tenderness: There is no abdominal tenderness. There is no guarding or rebound.  Musculoskeletal:        General: No tenderness.     Cervical back: Normal range of motion and neck supple.  Lymphadenopathy:     Cervical: No cervical adenopathy.  Skin:    Findings: No erythema or rash.  Neurological:     Mental Status: She is oriented to person, place, and time.     Cranial Nerves: No cranial nerve deficit.  Motor: No abnormal muscle tone.     Coordination: Coordination normal.     Deep Tendon Reflexes: Reflexes normal.  Psychiatric:        Behavior: Behavior normal.        Thought Content: Thought content normal.        Judgment: Judgment normal.   achy joints sad  Lab Results  Component Value Date   WBC 4.4 05/26/2019   HGB 12.7 05/26/2019   HCT 38.0 05/26/2019   PLT 238.0 05/26/2019   GLUCOSE 97 05/26/2019   CHOL 271 (H) 08/31/2014   TRIG 67.0 08/31/2014   HDL 67.30 08/31/2014   LDLDIRECT 182.5 10/05/2009   LDLCALC 190 (H) 08/31/2014   ALT 57 (H) 05/26/2019   AST 34 05/26/2019   NA 139 05/26/2019   K 3.9 05/26/2019   CL 105 05/26/2019   CREATININE 0.93 05/26/2019   BUN 24 (H) 05/26/2019   CO2 25 05/26/2019   TSH 1.71 05/26/2019    [place] Soft  Tissue Head/Neck  Result Date: 09/01/2014 CLINICAL DATA:  Thyroid goiter. EXAM: THYROID ULTRASOUND TECHNIQUE: Ultrasound examination of the thyroid gland and adjacent soft tissues was performed. COMPARISON:  None. FINDINGS: Right thyroid lobe Measurements: 3.8 x 1.5 x 1.7 cm. Thyroid tissue is mildly heterogeneous without a focal nodule. Left thyroid lobe Measurements: 3.3 x 1.5 x 1.1 cm.  No nodules visualized. Isthmus Thickness: 0.3 cm.  No nodules visualized. Lymphadenopathy None visualized. IMPRESSION: Unremarkable thyroid ultrasound. Thyroid tissue is mildly heterogeneous without nodules. Electronically Signed   By: Markus Daft M.D.   On: 09/01/2014 14:34    Assessment & Plan:   There are no diagnoses linked to this encounter.   No orders of the defined types were placed in this encounter.    Follow-up: No follow-ups on file.  Walker Kehr, MD

## 2019-07-27 NOTE — Patient Instructions (Signed)

## 2019-07-27 NOTE — Assessment & Plan Note (Signed)
Vit D 

## 2019-07-27 NOTE — Assessment & Plan Note (Signed)
Valtrex prn x3-5 d

## 2019-07-27 NOTE — Assessment & Plan Note (Signed)
better on gluten free, milk free, sugar free diet Vit D, B complex, Omega 3, Turmeric, CoQ10

## 2019-08-12 ENCOUNTER — Ambulatory Visit (INDEPENDENT_AMBULATORY_CARE_PROVIDER_SITE_OTHER)
Admission: RE | Admit: 2019-08-12 | Discharge: 2019-08-12 | Disposition: A | Discharge status: Home / Self Care | Payer: Self-pay | Source: Ambulatory Visit | Attending: Internal Medicine | Admitting: Internal Medicine

## 2019-08-12 ENCOUNTER — Other Ambulatory Visit: Payer: Self-pay

## 2019-08-12 DIAGNOSIS — Z8249 Family history of ischemic heart disease and other diseases of the circulatory system: Secondary | ICD-10-CM

## 2021-06-10 IMAGING — CT CT CARDIAC CORONARY ARTERY CALCIUM SCORE
3 series · 14 of 20 positions shown, 15 images · non-contrast
Comparison: None.
COMPARISON: None.

Addendum:
EXAM:
OVER-READ INTERPRETATION  CT CHEST

The following report is an over-read performed by radiologist Dr.
Cai Wiles [REDACTED] on 08/12/2019. This
over-read does not include interpretation of cardiac or coronary
anatomy or pathology. The coronary calcium score interpretation by
the cardiologist is attached.
CLINICAL DATA: Risk stratification
Coronary Calcium Score
TECHNIQUE: The patient was scanned on a Siemens Somatom 64 slice scanner. Axial
non-contrast 3 mm slices were carried out through the heart. The
data set was analyzed on a dedicated work station and scored using
the Agatson method.

[Series 2: casc 3.0 bv41 2 bestdiast 72 % · axial · 0.36mm/px · z∈[-184,-118]mm · 4 of 38 slices shown, 5 images]
[im 8/38  vessel]
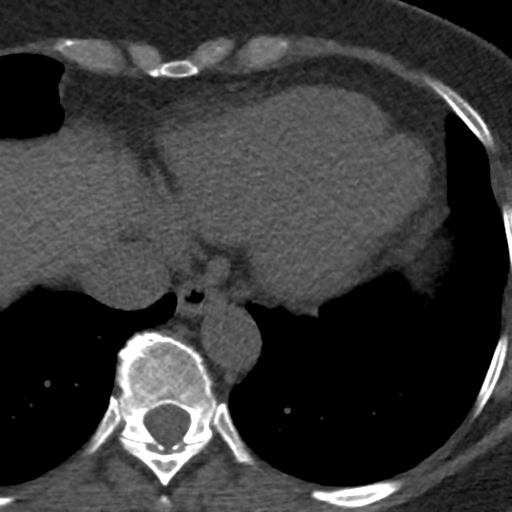
[im 8/38  lung]
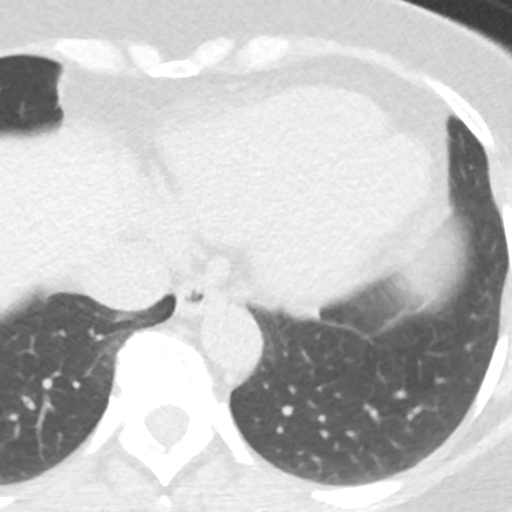
[im 15/38  vessel]
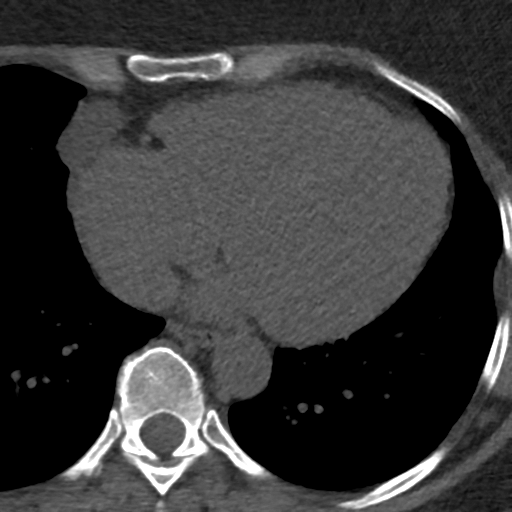
[im 23/38  vessel]
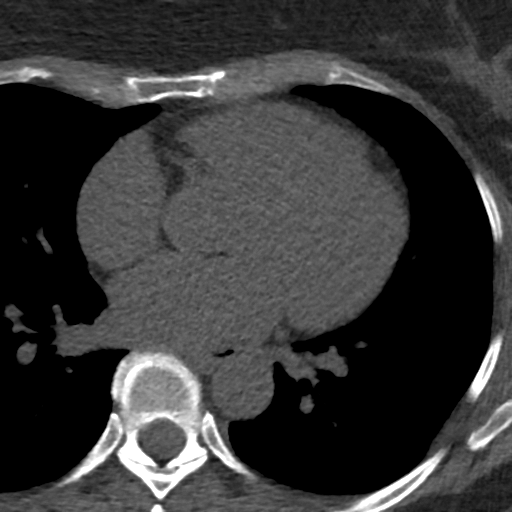
[im 30/38  vessel]
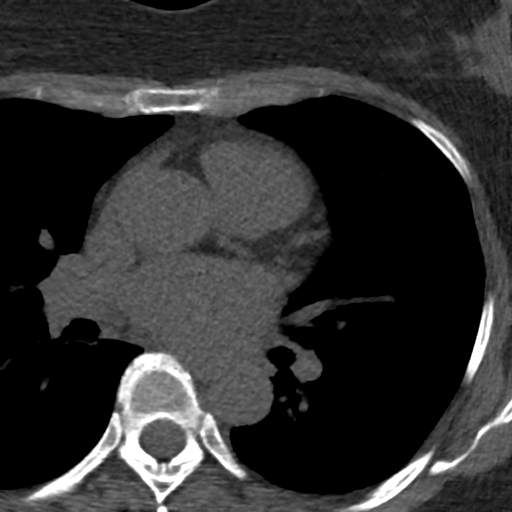

[Series 3: lung 72 % · axial · 0.62mm/px · z∈[-186,-114]mm · 5 of 38 slices shown]
[im 7/38  lung]
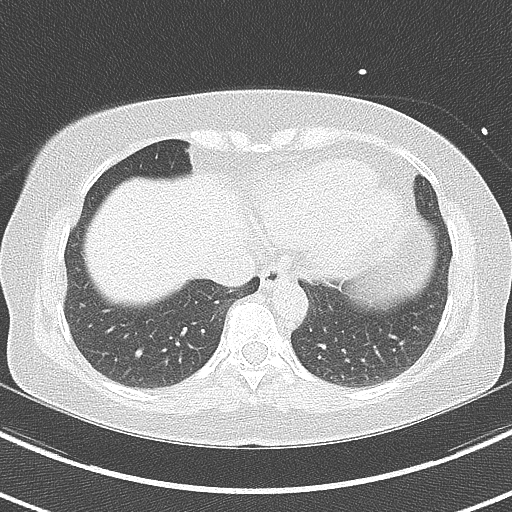
[im 13/38  lung]
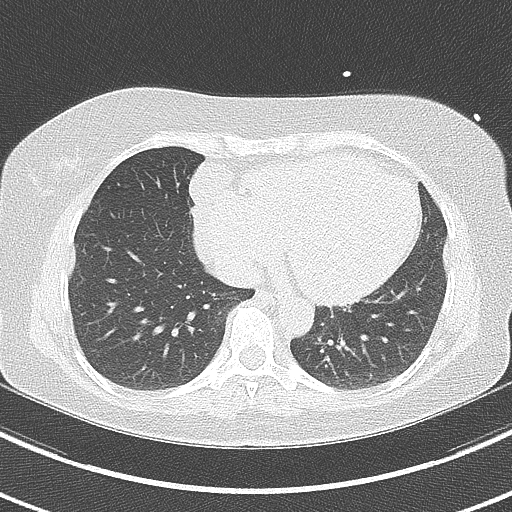
[im 19/38  lung]
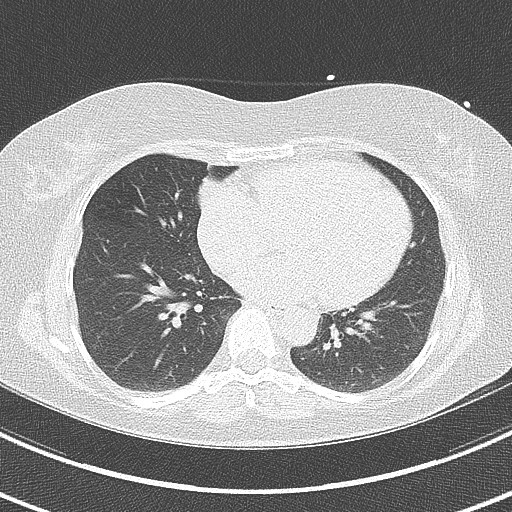
[im 25/38  lung]
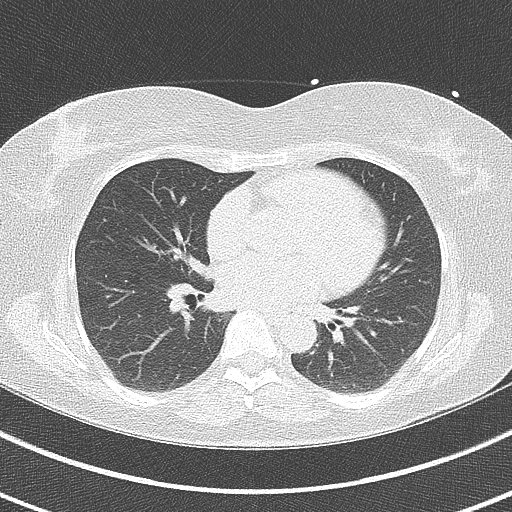
[im 31/38  lung]
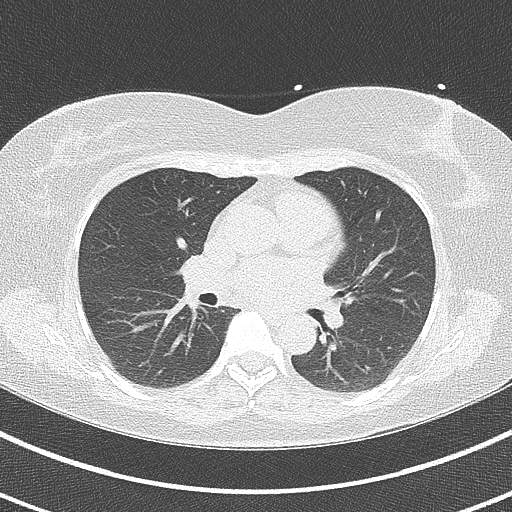

[Series 4: lung st 72 % · axial · 0.62mm/px · z∈[-186,-114]mm · 5 of 38 slices shown]
[im 7/38  lung]
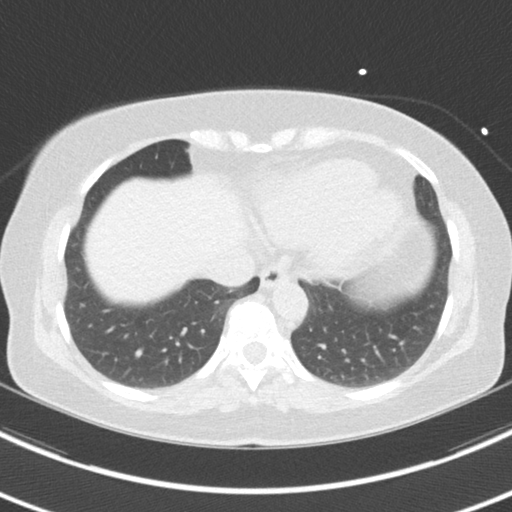
[im 13/38  lung]
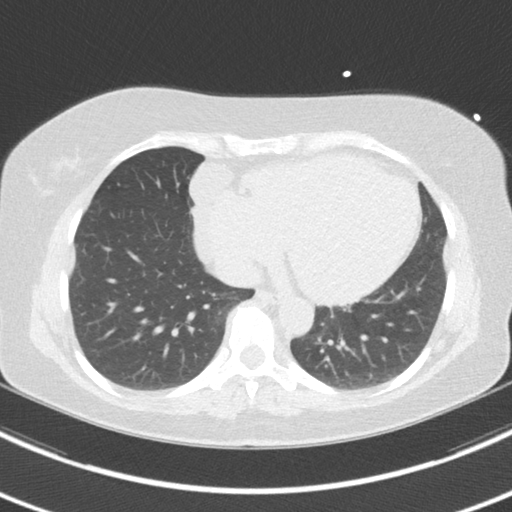
[im 19/38  lung]
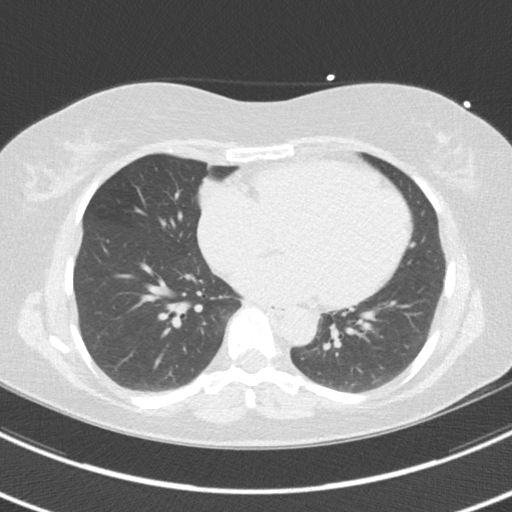
[im 25/38  lung]
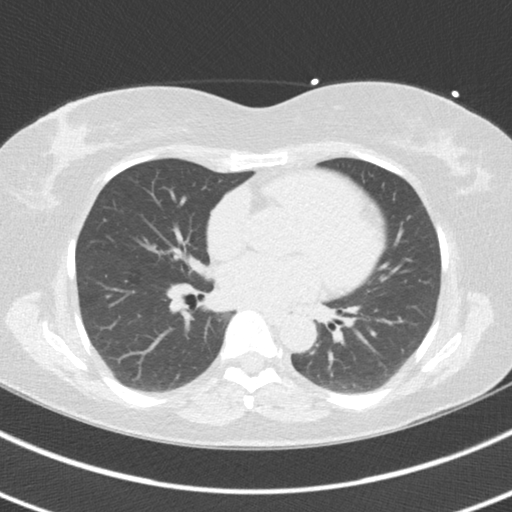
[im 31/38  lung]
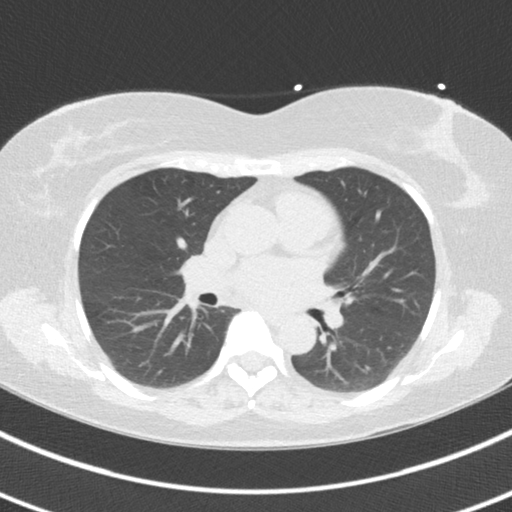

[14 of 20 positions shown; findings below may reference images not displayed]

FINDINGS: Low-attenuation lesion in the right cardiophrenic angle measuring
6.2 x 2.5 cm, likely to represent a small pericardial cyst. Within
the visualized portions of the thorax there are no suspicious
appearing pulmonary nodules or masses, there is no acute
consolidative airspace disease, no pleural effusions, no
pneumothorax and no lymphadenopathy. Visualized portions of the
upper abdomen are unremarkable. There are no aggressive appearing
lytic or blastic lesions noted in the visualized portions of the
skeleton.
IMPRESSION: 1. Probable right-sided pericardial cyst, as above.
FINDINGS: Non-cardiac: See separate report from [REDACTED].

Ascending aorta: Normal diameter 3.2 cm

Pericardium: Normal

Coronary arteries: No calcium noted
IMPRESSION: Coronary calcium score of 0.

See radiology note regarding probable benign pericardial cyst right
costophrenic angle

Lui Olguin

*** End of Addendum ***
EXAM:
OVER-READ INTERPRETATION  CT CHEST

The following report is an over-read performed by radiologist Dr.
Cai Wiles [REDACTED] on 08/12/2019. This
over-read does not include interpretation of cardiac or coronary
anatomy or pathology. The coronary calcium score interpretation by
the cardiologist is attached.
FINDINGS: Low-attenuation lesion in the right cardiophrenic angle measuring
6.2 x 2.5 cm, likely to represent a small pericardial cyst. Within
the visualized portions of the thorax there are no suspicious
appearing pulmonary nodules or masses, there is no acute
consolidative airspace disease, no pleural effusions, no
pneumothorax and no lymphadenopathy. Visualized portions of the
upper abdomen are unremarkable. There are no aggressive appearing
lytic or blastic lesions noted in the visualized portions of the
skeleton.
IMPRESSION: 1. Probable right-sided pericardial cyst, as above.
